# Patient Record
Sex: Male | Born: 1990 | Race: White | Hispanic: No | Marital: Single | Smoking: Current every day smoker
Health system: Southern US, Community
[De-identification: ages and names within clinical notes are randomized; demographics above are authoritative.]

---

## 2013-04-18 ENCOUNTER — Encounter (HOSPITAL_COMMUNITY): Payer: Self-pay | Admitting: Emergency Medicine

## 2013-04-18 ENCOUNTER — Emergency Department (INDEPENDENT_AMBULATORY_CARE_PROVIDER_SITE_OTHER)
Admission: EM | Admit: 2013-04-18 | Discharge: 2013-04-18 | Disposition: A | Discharge status: Home / Self Care | Payer: BC Managed Care – PPO | Source: Home / Self Care

## 2013-04-18 DIAGNOSIS — A084 Viral intestinal infection, unspecified: Secondary | ICD-10-CM

## 2013-04-18 DIAGNOSIS — A088 Other specified intestinal infections: Secondary | ICD-10-CM

## 2013-04-18 MED ORDER — ONDANSETRON 4 MG PO TBDP
8.0000 mg | ORAL_TABLET | Freq: Once | ORAL | Status: AC
  Administered 2013-04-18: 8 mg via ORAL

## 2013-04-18 MED ORDER — ONDANSETRON 8 MG PO TBDP: 8.0000 mg | ORAL_TABLET | Freq: Three times a day (TID) | ORAL | Status: DC | PRN

## 2013-04-18 MED ORDER — ONDANSETRON 4 MG PO TBDP
ORAL_TABLET | ORAL | Status: AC
  Filled 2013-04-18: qty 2

## 2013-04-18 NOTE — ED Provider Notes (Signed)
CSN: [id number]     Arrival date & time 04/18/13  1857 History   None    Chief Complaint  Patient presents with  . GI Problem    Patient is a 23 y.o. male presenting with GI illness. The history is provided by the patient.  GI Problem This is a new problem. The current episode started more than 2 days ago. The problem occurs daily. The problem has been gradually improving. Associated symptoms include abdominal pain. Pertinent negatives include no chest pain, no headaches and no shortness of breath. Nothing aggravates the symptoms. The symptoms are relieved by medications.  Pt reports onset of low grade fever (T-Max-100), body aches and diarrhea Thursday. Several episodes of diarrhea the first 24 hours that has resolved with OTC Imodium. Pt had onset of nausea Saturday that has persisted and worsened. Generalized abd pain and bloating persist. He has had no vomiting. Was around a family member last week with flu-like symptoms. Pt is eating and drinking but has noted decreased urinary output.  History reviewed. No pertinent past medical history. History reviewed. No pertinent past surgical history. History reviewed. No pertinent family history. History  Substance Use Topics  . Smoking status: Current Every Day Smoker  . Smokeless tobacco: Not on file  . Alcohol Use: Yes    Review of Systems  Constitutional: Positive for fever and appetite change.  HENT: Negative.   Eyes: Negative.   Respiratory: Negative.  Negative for shortness of breath.   Cardiovascular: Negative.  Negative for chest pain.  Gastrointestinal: Positive for nausea, abdominal pain and diarrhea. Negative for vomiting, constipation, blood in stool and abdominal distention.  Endocrine: Negative.   Genitourinary: Negative.   Musculoskeletal: Positive for myalgias.  Skin: Negative.   Allergic/Immunologic: Negative.   Neurological: Negative.  Negative for headaches.  Hematological: Negative.   Psychiatric/Behavioral:  Negative.     Allergies  Review of patient's allergies indicates no known allergies.  Home Medications   Current Outpatient Rx  Name  Route  Sig  Dispense  Refill  . ondansetron (ZOFRAN ODT) 8 MG disintegrating tablet   Oral   Take 1 tablet (8 mg total) by mouth every 8 (eight) hours as needed for nausea or vomiting.   20 tablet   0    BP 118/70  Pulse 84  Temp(Src) 99 F (37.2 C) (Oral)  Resp 18  SpO2 99% Physical Exam  Constitutional: He is oriented to person, place, and time. He appears well-developed and well-nourished. No distress.  HENT:  Head: Normocephalic and atraumatic.  Eyes: Conjunctivae are normal.  Cardiovascular: Normal rate and regular rhythm.   Pulmonary/Chest: Effort normal and breath sounds normal.  Abdominal: Soft. Bowel sounds are normal. He exhibits no distension and no ascites. No hernia. Hernia confirmed negative in the ventral area.  Very mild generalized abd TTP  Neurological: He is alert and oriented to person, place, and time.  Skin: Skin is warm and dry.  Psychiatric: He has a normal mood and affect.    ED Course  Procedures (including critical care time) Labs Review Labs Reviewed - No data to display Imaging Review No results found.   MDM   1. Viral gastroenteritis    4 days of symptoms c/w viral gastroenteritis. Diarrhea resolved w/ otc med but nausea persist. Pt eating and drinking despite nausea. Is not tachycardic. I do not appreciate significant dehydration. Zofran ODT 8mg  given here. Pt tolerating PO fluids prior to d/c. Rx for Zofran and instructions for bland diet and  cautious use of Imodium. Pt verbalizes understanding and is agreeable w/ plan.     Leanne ChangKatherine P Jahnia Hewes, NP 04/18/13 2027

## 2013-04-18 NOTE — ED Notes (Signed)
C/o fever, nausea (no vomiting) and diarrhea since 3-12; no similar ill contacts. NAD at present

## 2013-04-18 NOTE — Discharge Instructions (Signed)
Viral Gastroenteritis °Viral gastroenteritis is also called stomach flu. This illness is caused by a certain type of germ (virus). It can cause sudden watery poop (diarrhea) and throwing up (vomiting). This can cause you to lose body fluids (dehydration). This illness usually lasts for 3 to 8 days. It usually goes away on its own. °HOME CARE  °· Drink enough fluids to keep your pee (urine) clear or pale yellow. Drink small amounts of fluids often. °· Ask your doctor how to replace body fluid losses (rehydration). °· Avoid: °· Foods high in sugar. °· Alcohol. °· Bubbly (carbonated) drinks. °· Tobacco. °· Juice. °· Caffeine drinks. °· Very hot or cold fluids. °· Fatty, greasy foods. °· Eating too much at one time. °· Dairy products until 24 to 48 hours after your watery poop stops. °· You may eat foods with active cultures (probiotics). They can be found in some yogurts and supplements. °· Wash your hands well to avoid spreading the illness. °· Only take medicines as told by your doctor. Do not give aspirin to children. Do not take medicines for watery poop (antidiarrheals). °· Ask your doctor if you should keep taking your regular medicines. °· Keep all doctor visits as told. °GET HELP RIGHT AWAY IF:  °· You cannot keep fluids down. °· You do not pee at least once every 6 to 8 hours. °· You are short of breath. °· You see blood in your poop or throw up. This may look like coffee grounds. °· You have belly (abdominal) pain that gets worse or is just in one small spot (localized). °· You keep throwing up or having watery poop. °· You have a fever. °· The patient is a child younger than 3 months, and he or she has a fever. °· The patient is a child older than 3 months, and he or she has a fever and problems that do not go away. °· The patient is a child older than 3 months, and he or she has a fever and problems that suddenly get worse. °· The patient is a baby, and he or she has no tears when crying. °MAKE SURE YOU:    °· Understand these instructions. °· Will watch your condition. °· Will get help right away if you are not doing well or get worse. °Document Released: 07/10/2007 Document Revised: 04/15/2011 Document Reviewed: 11/07/2010 °ExitCare® Patient Information ©2014 [employer]. ° °

## 2013-04-18 NOTE — ED Notes (Signed)
Encouraged to sip on gingerale

## 2013-04-20 NOTE — ED Provider Notes (Signed)
Medical screening examination/treatment/procedure(s) were performed by resident physician or non-physician practitioner and as supervising physician I was immediately available for consultation/collaboration.   Barkley BrunsKINDL,Minta Fair DOUGLAS MD.   Linna HoffJames D Karliah Kowalchuk, MD 04/20/13 [phone]

## 2013-11-15 ENCOUNTER — Encounter (HOSPITAL_COMMUNITY): Payer: Self-pay | Admitting: Emergency Medicine

## 2013-11-15 ENCOUNTER — Emergency Department (HOSPITAL_COMMUNITY)
Admission: EM | Admit: 2013-11-15 | Discharge: 2013-11-16 | Disposition: A | Discharge status: Other Acute Inpatient Hospital | Payer: BC Managed Care – PPO | Attending: Emergency Medicine | Admitting: Emergency Medicine

## 2013-11-15 DIAGNOSIS — R45851 Suicidal ideations: Secondary | ICD-10-CM | POA: Insufficient documentation

## 2013-11-15 DIAGNOSIS — F32A Depression, unspecified: Secondary | ICD-10-CM

## 2013-11-15 DIAGNOSIS — Z720 Tobacco use: Secondary | ICD-10-CM | POA: Diagnosis not present

## 2013-11-15 DIAGNOSIS — F329 Major depressive disorder, single episode, unspecified: Secondary | ICD-10-CM

## 2013-11-15 NOTE — ED Notes (Signed)
Pt. reports feeling depressed with suicidal ideation for 2 weeks , pt. did not disclose plan of suicide , no hallucinations .

## 2013-11-16 ENCOUNTER — Encounter (HOSPITAL_COMMUNITY): Payer: Self-pay | Admitting: *Deleted

## 2013-11-16 ENCOUNTER — Observation Stay (HOSPITAL_COMMUNITY)
Admission: AD | Admit: 2013-11-16 | Discharge: 2013-11-17 | Disposition: A | Discharge status: Home / Self Care | Payer: BC Managed Care – PPO | Source: Intra-hospital | Attending: Nurse Practitioner | Admitting: Nurse Practitioner

## 2013-11-16 DIAGNOSIS — F329 Major depressive disorder, single episode, unspecified: Principal | ICD-10-CM | POA: Insufficient documentation

## 2013-11-16 DIAGNOSIS — R45851 Suicidal ideations: Secondary | ICD-10-CM

## 2013-11-16 DIAGNOSIS — F332 Major depressive disorder, recurrent severe without psychotic features: Secondary | ICD-10-CM

## 2013-11-16 LAB — CBC WITH DIFFERENTIAL/PLATELET
BASOS PCT: 0 % (ref 0–1)
Basophils Absolute: 0 10*3/uL (ref 0.0–0.1)
EOS ABS: 0.2 10*3/uL (ref 0.0–0.7)
EOS PCT: 2 % (ref 0–5)
HCT: 46 % (ref 39.0–52.0)
Hemoglobin: 16.5 g/dL (ref 13.0–17.0)
LYMPHS ABS: 2.2 10*3/uL (ref 0.7–4.0)
Lymphocytes Relative: 34 % (ref 12–46)
MCH: 30.6 pg (ref 26.0–34.0)
MCHC: 35.9 g/dL (ref 30.0–36.0)
MCV: 85.2 fL (ref 78.0–100.0)
Monocytes Absolute: 0.4 10*3/uL (ref 0.1–1.0)
Monocytes Relative: 6 % (ref 3–12)
NEUTROS PCT: 58 % (ref 43–77)
Neutro Abs: 3.9 10*3/uL (ref 1.7–7.7)
PLATELETS: 217 10*3/uL (ref 150–400)
RBC: 5.4 MIL/uL (ref 4.22–5.81)
RDW: 12 % (ref 11.5–15.5)
WBC: 6.7 10*3/uL (ref 4.0–10.5)

## 2013-11-16 LAB — COMPREHENSIVE METABOLIC PANEL
ALBUMIN: 4.1 g/dL (ref 3.5–5.2)
ALT: 31 U/L (ref 0–53)
ANION GAP: 13 (ref 5–15)
AST: 16 U/L (ref 0–37)
Alkaline Phosphatase: 70 U/L (ref 39–117)
BUN: 10 mg/dL (ref 6–23)
CO2: 26 mEq/L (ref 19–32)
Calcium: 9.4 mg/dL (ref 8.4–10.5)
Chloride: 99 mEq/L (ref 96–112)
Creatinine, Ser: 0.97 mg/dL (ref 0.50–1.35)
GFR calc Af Amer: >90 mL/min (ref >90)
GFR calc non Af Amer: >90 mL/min (ref >90)
Glucose, Bld: 89 mg/dL (ref 70–99)
POTASSIUM: 3.8 meq/L (ref 3.7–5.3)
SODIUM: 138 meq/L (ref 137–147)
TOTAL PROTEIN: 8 g/dL (ref 6.0–8.3)
Total Bilirubin: 0.3 mg/dL (ref 0.3–1.2)

## 2013-11-16 LAB — RAPID URINE DRUG SCREEN, HOSP PERFORMED
Amphetamines: NOT DETECTED
BENZODIAZEPINES: NOT DETECTED
Barbiturates: NOT DETECTED
COCAINE: NOT DETECTED
Opiates: NOT DETECTED
TETRAHYDROCANNABINOL: NOT DETECTED

## 2013-11-16 LAB — ETHANOL

## 2013-11-16 MED ORDER — ALUM & MAG HYDROXIDE-SIMETH 200-200-20 MG/5ML PO SUSP: 30.0000 mL | ORAL | Status: DC | PRN

## 2013-11-16 MED ORDER — MAGNESIUM HYDROXIDE 400 MG/5ML PO SUSP: 30.0000 mL | Freq: Every day | ORAL | Status: DC | PRN

## 2013-11-16 MED ORDER — CITALOPRAM HYDROBROMIDE 10 MG PO TABS
10.0000 mg | ORAL_TABLET | Freq: Every day | ORAL | Status: DC
  Administered 2013-11-16 – 2013-11-17 (×2): 10 mg via ORAL
  Filled 2013-11-16 (×5): qty 1

## 2013-11-16 MED ORDER — ACETAMINOPHEN 325 MG PO TABS: 650.0000 mg | ORAL_TABLET | ORAL | Status: DC | PRN

## 2013-11-16 MED ORDER — ACETAMINOPHEN 325 MG PO TABS: 650.0000 mg | ORAL_TABLET | Freq: Four times a day (QID) | ORAL | Status: DC | PRN

## 2013-11-16 MED ORDER — ZOLPIDEM TARTRATE 5 MG PO TABS: 5.0000 mg | ORAL_TABLET | Freq: Every evening | ORAL | Status: DC | PRN

## 2013-11-16 MED ORDER — IBUPROFEN 400 MG PO TABS: 600.0000 mg | ORAL_TABLET | Freq: Three times a day (TID) | ORAL | Status: DC | PRN

## 2013-11-16 MED ORDER — TRAZODONE HCL 50 MG PO TABS: 50.0000 mg | ORAL_TABLET | Freq: Every evening | ORAL | Status: DC | PRN

## 2013-11-16 MED ORDER — ONDANSETRON HCL 4 MG PO TABS: 4.0000 mg | ORAL_TABLET | Freq: Three times a day (TID) | ORAL | Status: DC | PRN

## 2013-11-16 MED ORDER — LORAZEPAM 1 MG PO TABS: 1.0000 mg | ORAL_TABLET | Freq: Three times a day (TID) | ORAL | Status: DC | PRN

## 2013-11-16 MED ORDER — HYDROXYZINE HCL 25 MG PO TABS: 25.0000 mg | ORAL_TABLET | Freq: Four times a day (QID) | ORAL | Status: DC | PRN

## 2013-11-16 MED ORDER — NICOTINE 21 MG/24HR TD PT24
21.0000 mg | MEDICATED_PATCH | Freq: Every day | TRANSDERMAL | Status: DC
  Administered 2013-11-16: 21 mg via TRANSDERMAL
  Filled 2013-11-16 (×5): qty 1

## 2013-11-16 NOTE — BH Assessment (Signed)
TTS assessment complete.  Safira Proffit, MS, LCASA Assessment Counselor  

## 2013-11-16 NOTE — Progress Notes (Signed)
Patient ID: [name], male   DOB: 1990-11-24, 23 y.o.   MRN: [id number] D-settling in to the unit, slept most of the shift since arriving. Spoke with his girlfriend on the phone. He did meet with the extender, and will be started on medications tonight; Celexa for his depression. A-Support offered, Monitored for safety. Medications as ordered. R-No complaints voiced. Working on [employer] and reading magazines made available to him. Pleasant and no complaints.

## 2013-11-16 NOTE — BH Assessment (Signed)
Tele Assessment Note   [name] is an 23 y.o. male. Pt presents to [facility] with C/O increased depression and SI. Pt reports recent verbal conflict with his girlfriend over the past week as a stressor. Pt reports that he presented to the hospital because his girlfriend is concerned about him. Patient reports that he discontinued taking his psychiatric medication Seroquel over a month ago due to side effects.  Patient reports that the medication made him sleep too much. Patient reports suffering from withdraw symptoms of N/V for a week after he stopped taking his prescribed Seroquel.  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 Patient reports increased anxiety when he is around a lot of people.  Patient reports erratic sleep as reports sometimes he sleeps 4 hours and sometimes 13 hours at a time. Pt reports increased weight gain over the past 3 months. Patient reports that he gained 30lbs over the past 2-3 months. Pt denies HI and no AVH. Patient reports that he wants to be evaluated so he can be started on the "right" medications.   Patient reports that he has an appointment scheduled with his Psychiatric Provider [name], PA  on 11-29-13 and has another appointment with his therapist on 11-19-13(Larry Letta KocherWillet). Patient is unable to reliably contract for safety until he can meet with his providers.  Consulted with AC Thurman CoyerEric Kaplan and Ellen Henrionrad Withrow,NP whom is recommending inpatient psychiatric treatment for safety and stabilization. Pt has been assigned to observation bed #3.  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 Axis IV: other psychosocial or environmental problems and problems related to social environment Axis V: 21-30 behavior considerably influenced by delusions or hallucinations OR serious impairment in judgment,  communication OR inability to function in almost all areas  Past Medical History: No past medical history on file.  No past surgical history on file.  Family History: No family history on file.  Social History:  reports that he has been smoking.  He does not have any smokeless tobacco history on file. He reports that he drinks alcohol. He reports that he uses illicit drugs (Marijuana).  Additional Social History:  Alcohol / Drug Use Pain Medications: not abusing Prescriptions: not abusing Over the Counter: not abusing History of alcohol / drug use?:  (pt reports hx of etoh and THC use on occasion at least 1x per month.)  CIWA: CIWA-Ar BP: 143/83 mmHg Pulse Rate: 56 COWS:    PATIENT STRENGTHS: (choose at least two) Average or above average intelligence Capable of independent living  Allergies: No Known Allergies  Home Medications:  No prescriptions prior to admission    OB/GYN Status:  No LMP for male patient.  General Assessment Data [facility] Is this a Tele or Face-to-Face Assessment?: Tele Assessment Is this an Initial Assessment or a Re-assessment for this encounter?: Initial Assessment Living Arrangements: Spouse/significant other Can pt return to current living arrangement?: Yes Admission Status: Voluntary Is patient capable of signing voluntary admission?: Yes Transfer from: Home Referral Source: MD     [facility] Living Arrangements: Spouse/significant other Name of Psychiatrist: [name], PA([facility]) Name of Therapist: Janalee DaneLarry Willet  Education Status Is patient currently in school?: Yes Current Grade: ArchivistCollege Student Highest grade of school patient has completed: 12th Name of school: [employer] Contact person: n/a  Risk to self with  the past 6 months Suicidal Ideation: Yes-Currently Present Suicidal Intent: No-Not Currently/Within Last 6 Months Is patient at risk for suicide?: Yes Suicidal Plan?: No Access  to Means: No What has been your use of drugs/alcohol within the last 12 months?: occasional THC and etoh use Previous Attempts/Gestures: No How many times?: 0 Other Self Harm Risks: none reported Triggers for Past Attempts: None known Intentional Self Injurious Behavior: None Family Suicide History: No Recent stressful life event(s): Conflict (Comment) (verbal conflict with girlfriend, stopped taking medication.) Persecutory voices/beliefs?: No Depression: Yes Depression Symptoms: Despondent;Insomnia;Fatigue;Loss of interest in usual pleasures;Feeling worthless/self pity Substance abuse history and/or treatment for substance abuse?: No Suicide prevention information given to non-admitted patients: Not applicable  Risk to Others within the past 6 months Homicidal Ideation: No Thoughts of Harm to Others: No Current Homicidal Intent: No Current Homicidal Plan: No Access to Homicidal Means: No Identified Victim: na History of harm to others?: No Assessment of Violence: None Noted Violent Behavior Description: None Noted Does patient have access to weapons?: Yes (Comment) (pt reports access to kitchen knives and collection of swords) Criminal Charges Pending?: No Does patient have a court date: No  Psychosis Hallucinations: None noted Delusions: None noted  Mental Status Report Appear/Hygiene: In scrubs Eye Contact: Fair Motor Activity: Freedom of movement Speech: Logical/coherent Level of Consciousness: Alert Mood: Depressed Affect: Appropriate to circumstance Anxiety Level: None Thought Processes: Coherent;Relevant Judgement: Unimpaired Orientation: Person;Place;Time;Situation Obsessive Compulsive Thoughts/Behaviors: None  Cognitive Functioning Concentration: Normal Memory: Recent Intact;Remote Intact IQ: Average Insight: Fair Impulse Control: Fair Appetite: Fair (pt reports increased appetite causing excessive weight gain) Weight Loss: 0 Weight Gain:  (30) Sleep:   (varies- one day 4 hrs, the next day 14 hours of sleep) Total Hours of Sleep:  (varies significantly ) Vegetative Symptoms: None  ADLScreening [facility]) Patient's cognitive ability adequate to safely complete daily activities?: Yes Patient able to express need for assistance with ADLs?: Yes Independently performs ADLs?: Yes (appropriate for developmental age)  Prior Inpatient Therapy Prior Inpatient Therapy: No Prior Therapy Dates: na Prior Therapy Facilty/Provider(s): na Reason for Treatment: na  Prior Outpatient Therapy Prior Outpatient Therapy: Yes Prior Therapy Dates: Current Provider Prior Therapy Facilty/Provider(s): [facility] Reason for Treatment: Med management/OPT  ADL Screening (condition at time of admission) Patient's cognitive ability adequate to safely complete daily activities?: Yes Is the patient deaf or have difficulty hearing?: No Does the patient have difficulty seeing, even when wearing glasses/contacts?: No Does the patient have difficulty concentrating, remembering, or making decisions?: No Patient able to express need for assistance with ADLs?: Yes Does the patient have difficulty dressing or bathing?: No Independently performs ADLs?: Yes (appropriate for developmental age) Does the patient have difficulty walking or climbing stairs?: No Weakness of Legs: None Weakness of Arms/Hands: None  Home Assistive Devices/Equipment Home Assistive Devices/Equipment: None  Therapy Consults (therapy consults require a physician order) PT Evaluation Needed: No OT Evalulation Needed: No SLP Evaluation Needed: No Abuse/Neglect Assessment (Assessment to be complete while patient is alone) Physical Abuse: Denies Verbal Abuse: Denies Sexual Abuse: Denies Exploitation of patient/patient's resources: Denies Self-Neglect: Denies Values / Beliefs Cultural Requests During Hospitalization: None Spiritual Requests During Hospitalization:  None Consults Spiritual Care Consult Needed: No Social Work Consult Needed: No Merchant navy officerAdvance Directives (For Healthcare) Does patient have an advance directive?: No Would patient like information on creating an advanced directive?: No - patient declined information Nutrition Screen- [facility] Adult/[facility]/AP Patient's home diet: Regular  Additional Information 1:1 In Past 12 Months?: No  CIRT Risk: No Elopement Risk: No Does patient have medical clearance?: Yes     Disposition:  Disposition Initial Assessment Completed for this Encounter: Yes Disposition of Patient: Inpatient treatment program (Per Renata Caprice pt accepted to observation unit bed #3.) Type of inpatient treatment program: Adult  Gerline Legacy, MS, LCASA Assessment Counselor  11/16/2013 2:07 PM

## 2013-11-16 NOTE — Progress Notes (Signed)
Pt alert, oriented and cooperative. Affect/mood sad and depressed. Denies SI/HI at present, verbally contracts for safety. -A/[place]. Denies pain or discomfort. C/o stressors r/t school, recent move in with girlfriend and quit job "much life changes". Emotional support and encouragement given. Will continue to monitor closely and evaluate for stabilization.

## 2013-11-16 NOTE — Progress Notes (Signed)
Patient ID: [name], male   DOB: Jan 14, 1991, 23 y.o.   MRN: [id number] Admission Note-[facility] after he presented to the ED last HS with increasing depressive sx and thoughts of suicide with plan to overdose on medication.He currently says he thinks about it but doesn't think he would ever do something and he is able to verbalize safety while here. He denies psychotic sx. He is not dangerous to others. He has no previous hospitalizations. He has been seeing a therapist and Miguel extender thru [facility] for two months. He has taken Seroquel one month ago but stopped it himself because he thought it made his depression worse and he was too drowsy. He is a current Consulting civil engineerstudent at [employer] and states his school work and attendance hasnt suffered with his depression.No substance use of significance. He drinks twice a month and will smoke THC twice a month but not to excess. He has no medical problems and no known family history of mental health issues, but he knows very little about his fathers side of the family. He is living with his girlfriend and her cousin. His girlfriend was the factor that brought him to the hospital last night because she was concerned for him and believes he has been acting irrationally. He endorses depression sx of poor sleep and never rested, low motivation, decreased concentration and thoughts to hurt self. States he has been depressed on and off for years. Given food and drink on admission, settled in to the unit and waiting on the Dr. For their recommendation. Cooperative with the admission process.

## 2013-11-16 NOTE — ED Provider Notes (Signed)
CSN: [id number]     Arrival date & time 11/15/13  2314 History   First MD Initiated Contact with Patient 11/15/13 2346     Chief Complaint  Patient presents with  . Suicidal     (Consider location/radiation/quality/duration/timing/severity/associated sxs/prior Treatment) The history is provided by the patient and medical records.   This is a 23 year old male with past medical history significant for depression, presenting to the ED for increased depression and suicidal ideation over the past 2 weeks. Patient states about one month ago he stopped taking his Seroquel because it was making him drowsy and felt his depression was worsening. He states he has also recently moved in with his girlfriend and started back to school which is causing added stress. He denies any specific plan of suicide. He has no prior history of suicide attempts in the past. He denies any homicidal ideation. He denies any auditory or visual hallucinations. Patient has taken multiple anti-depressants in the past (lamictal, zoloft, seroquel)-- states that lamictal managed his symptoms the best, but he had multiple adverse reactions (dry mouth, etc).  Occasional EtOH and marijuana use, denies recent use.  Patient [facility].  Patient denies other complaints at this time.  VS stable on arrival.  History reviewed. No pertinent past medical history. History reviewed. No pertinent past surgical history. No family history on file. History  Substance Use Topics  . Smoking status: Current Every Day Smoker  . Smokeless tobacco: Not on file  . Alcohol Use: Yes    Review of Systems  Psychiatric/Behavioral: Positive for suicidal ideas.  All other systems reviewed and are negative.     Allergies  Review of patient's allergies indicates no known allergies.  Home Medications   Prior to Admission medications   Not on File   BP 147/94  Pulse 69  Temp(Src) 98.2 F  (36.8 C)  Resp 18  Wt 180 lb 8 oz (81.874 kg)  SpO2 98%  Physical Exam  Nursing note and vitals reviewed. Constitutional: He is oriented to person, place, and time. He appears well-developed and well-nourished. No distress.  HENT:  Head: Normocephalic and atraumatic.  Mouth/Throat: Oropharynx is clear and moist.  Eyes: Conjunctivae and EOM are normal. Pupils are equal, round, and reactive to light.  Neck: Normal range of motion. Neck supple.  Cardiovascular: Normal rate, regular rhythm and normal heart sounds.   Pulmonary/Chest: Effort normal and breath sounds normal. No respiratory distress. He has no wheezes.  Abdominal: Soft. Bowel sounds are normal. There is no tenderness. There is no guarding.  Musculoskeletal: Normal range of motion. He exhibits no edema.  Neurological: He is alert and oriented to person, place, and time.  Skin: Skin is warm and dry. He is not diaphoretic.  Psychiatric: He is not withdrawn and not actively hallucinating. He exhibits a depressed mood. He expresses suicidal ideation. He expresses no homicidal ideation. He expresses no suicidal plans and no homicidal plans.  Depressed mood, flat affect; SI without plan; denies HI/AVH    ED Course  Procedures (including critical care time) Labs Review Labs Reviewed  ETHANOL  URINE RAPID DRUG SCREEN (HOSP PERFORMED)  CBC WITH DIFFERENTIAL  COMPREHENSIVE METABOLIC PANEL    Imaging Review No results found.   EKG Interpretation None      MDM   Final diagnoses:  Suicidal ideation  Depression   23 year old male with increased depression and suicidal ideation.  He does note added stress from recent change in housing situation  and starting school.  He has no current physical complaints. Physical exam is unremarkable. Lab work was obtained which is reassuring. Patient medically cleared and awaiting TTS evaluation. Temporary holding orders and all meds in place.  Garlon HatchetLisa M Dorinne Graeff, PA-C 11/16/13 [phone]

## 2013-11-16 NOTE — BH Assessment (Signed)
Spoke with EDP Dr.Kohut to inform him that patient has been accepted to obs unit bed #4 per Ellen Henrionrad Withrow,NP. Attending assigned is Liana Geroldr.Archana Kumar.    Pod C nurse Aundra MilletMegan has been notified of pt's acceptance and will complete voluntary support paperwork for patient and coordinate transport with [facility].   Glorious PeachNajah Sila Sarsfield, MS, LCASA Assessment Counselor

## 2013-11-16 NOTE — ED Notes (Signed)
[facility] called for transport. 

## 2013-11-16 NOTE — H&P (Signed)
[facility] OBS UNIT H&P  Patient Identification:  [name] Date of Evaluation:  11/16/2013 Chief Complaint:  DEPRESSIVE DISORDER NOS  Subjective: Pt seen and chart reviewed. Pt reports SI without plan, cannot contract for safety. Denies HI and AVH. Pt states that he recently started school and had to move out of a previous home and she felt overwhelmed.    History of Present Illness:  This is a 23 year old male with past medical history significant for depression, presenting to the ED for increased depression and suicidal ideation over the past 2 weeks. Patient states about one month ago he stopped taking his Seroquel because it was making him drowsy and felt his depression was worsening. He states he has also recently moved in with his girlfriend and started back to school which is causing added stress. He denies any specific plan of suicide. He has no prior history of suicide attempts in the past. He denies any homicidal ideation. He denies any auditory or visual hallucinations. Patient has taken multiple anti-depressants in the past (lamictal, zoloft, seroquel)-- states that lamictal managed his symptoms the best, but he had multiple adverse reactions (dry mouth, etc). Occasional EtOH and marijuana use, denies recent use. Patient recently re-established with psychologist and psychiatrist at [facility]. Patient denies other complaints at this time. VS stable on arrival.   Total Time spent with patient: 30 minutes  Psychiatric Specialty Exam: Physical Exam  Psychiatric: He has a normal mood and affect. His speech is normal and behavior is normal. Judgment and thought content normal. Cognition and memory are normal.    Review of Systems  Constitutional: Negative.   HENT: Negative.   Eyes: Negative.   Respiratory: Negative.   Cardiovascular: Negative.   Gastrointestinal: Negative.   Genitourinary: Negative.   Musculoskeletal: Negative.   Skin: Negative.   Neurological: Negative.    Endo/Heme/Allergies: Negative.   Psychiatric/Behavioral: Positive for depression. Negative for suicidal ideas, hallucinations, memory loss and substance abuse. The patient is nervous/anxious. The patient does not have insomnia.     Blood pressure 143/83, pulse 56, temperature 98.6 F (37 C), temperature source Oral, resp. rate 18, height _0  (1.778 m), weight 81.647 kg (180 lb).Body mass index is 25.83 kg/(m^2).  General Appearance: Fairly Groomed  Engineer, water::  Good  Speech:  Normal Rate  Volume:  Normal  Mood:  Depressed  Affect:  Appropriate  Thought Process:  Linear  Orientation:  Full (Time, Place, and Person)  Thought Content:  Rumination  Suicidal Thoughts:  No  Homicidal Thoughts:  No  Memory:  Immediate;   Good Recent;   Good Remote;   Good  Judgement:  Good  Insight:  Good  Psychomotor Activity:  Normal  Concentration:  Good  Recall:  Good  Fund of Knowledge:Good  Language: Negative  Akathisia:  Negative  Handed:  Right  AIMS (if indicated):     Assets:  Communication Skills Desire for Improvement Resilience Social Support  Sleep:       Musculoskeletal: Strength & Muscle Tone: within normal limits Gait & Station: normal Patient leans: N/A  Past Medical History:  No past medical history on file. None. Allergies:  No Known Allergies PTA Medications: No prescriptions prior to admission    Previous Psychotropic Medications:  Medication/Dose  SEE MAR               Substance Abuse History in the last 12 months:  No.  Consequences of Substance Abuse: NA  Social History:  reports that he has  been smoking.  He does not have any smokeless tobacco history on file. He reports that he drinks alcohol. His drug history is not on file. Additional Social History: Pain Medications: not abusing Prescriptions: not abusing Over the Counter: not abusing History of alcohol / drug use?: No history of alcohol / drug abuse  Family History:  No family  history on file.  Results for orders placed during the hospital encounter of 11/15/13 (from the past 72 hour(s))  ETHANOL     Status: None   Collection Time    11/15/13 11:45 PM      Result Value Ref Range   Alcohol, Ethyl (B) <11  0 - 11 mg/dL   Comment:            LOWEST DETECTABLE LIMIT FOR     SERUM ALCOHOL IS 11 mg/dL     FOR MEDICAL PURPOSES ONLY  CBC WITH DIFFERENTIAL     Status: None   Collection Time    11/15/13 11:45 PM      Result Value Ref Range   WBC 6.7  4.0 - 10.5 K/uL   RBC 5.40  4.22 - 5.81 MIL/uL   Hemoglobin 16.5  13.0 - 17.0 g/dL   HCT 46.0  39.0 - 52.0 %   MCV 85.2  78.0 - 100.0 fL   MCH 30.6  26.0 - 34.0 pg   MCHC 35.9  30.0 - 36.0 g/dL   RDW 12.0  11.5 - 15.5 %   Platelets 217  150 - 400 K/uL   Neutrophils Relative % 58  43 - 77 %   Neutro Abs 3.9  1.7 - 7.7 K/uL   Lymphocytes Relative 34  12 - 46 %   Lymphs Abs 2.2  0.7 - 4.0 K/uL   Monocytes Relative 6  3 - 12 %   Monocytes Absolute 0.4  0.1 - 1.0 K/uL   Eosinophils Relative 2  0 - 5 %   Eosinophils Absolute 0.2  0.0 - 0.7 K/uL   Basophils Relative 0  0 - 1 %   Basophils Absolute 0.0  0.0 - 0.1 K/uL  COMPREHENSIVE METABOLIC PANEL     Status: None   Collection Time    11/15/13 11:45 PM      Result Value Ref Range   Sodium 138  137 - 147 mEq/L   Potassium 3.8  3.7 - 5.3 mEq/L   Chloride 99  96 - 112 mEq/L   CO2 26  19 - 32 mEq/L   Glucose, Bld 89  70 - 99 mg/dL   BUN 10  6 - 23 mg/dL   Creatinine, Ser 0.97  0.50 - 1.35 mg/dL   Calcium 9.4  8.4 - 10.5 mg/dL   Total Protein 8.0  6.0 - 8.3 g/dL   Albumin 4.1  3.5 - 5.2 g/dL   AST 16  0 - 37 U/L   ALT 31  0 - 53 U/L   Alkaline Phosphatase 70  39 - 117 U/L   Total Bilirubin 0.3  0.3 - 1.2 mg/dL   GFR calc non Af Amer >90  >90 mL/min   GFR calc Af Amer >90  >90 mL/min   Comment: (NOTE)     The eGFR has been calculated using the CKD EPI equation.     This calculation has not been validated in all clinical situations.     eGFR's persistently  <90 mL/min signify possible Chronic Kidney     Disease.   Anion gap 13  5 - 15  URINE RAPID DRUG SCREEN (HOSP PERFORMED)     Status: None   Collection Time    11/15/13 11:51 PM      Result Value Ref Range   Opiates NONE DETECTED  NONE DETECTED   Cocaine NONE DETECTED  NONE DETECTED   Benzodiazepines NONE DETECTED  NONE DETECTED   Amphetamines NONE DETECTED  NONE DETECTED   Tetrahydrocannabinol NONE DETECTED  NONE DETECTED   Barbiturates NONE DETECTED  NONE DETECTED   Comment:            DRUG SCREEN FOR MEDICAL PURPOSES     ONLY.  IF CONFIRMATION IS NEEDED     FOR ANY PURPOSE, NOTIFY LAB     WITHIN 5 DAYS.                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 AXIS IV:  other psychosocial or environmental problems AXIS V:  51-60 moderate symptoms   Treatment Plan/Recommendations:   -Celexa 33m daily for depression -Vistaril 276mq6h PRN for anxiety  Treatment Plan Summary: Daily contact with patient to assess and evaluate symptoms and progress in treatment Medication management Current Medications:  No current facility-administered medications for this encounter.    ShWaymartAGNP-BC 11/16/2013 7:39 pm

## 2013-11-16 NOTE — Progress Notes (Signed)
[facility] INPATIENT:  Family/Significant Other Suicide Prevention Education  Suicide Prevention Education:  Patient Refusal for Family/Significant Other Suicide Prevention Education: The patient [name] has refused to provide written consent for family/significant other to be provided Family/Significant Other Suicide Prevention Education during admission and/or prior to discharge.  Physician notified.  Wynona LunaBeck, Eran Mistry K 11/16/2013, 12:53 PM

## 2013-11-16 NOTE — ED Notes (Signed)
Girlfriend given information on visiting hours, explained process.  Her number is [phone].  She can be called if he need transportation home.

## 2013-11-17 DIAGNOSIS — F329 Major depressive disorder, single episode, unspecified: Secondary | ICD-10-CM

## 2013-11-17 MED ORDER — HYDROXYZINE HCL 25 MG PO TABS: 25.0000 mg | ORAL_TABLET | Freq: Four times a day (QID) | ORAL | Status: AC | PRN

## 2013-11-17 MED ORDER — CITALOPRAM HYDROBROMIDE 10 MG PO TABS: 10.0000 mg | ORAL_TABLET | Freq: Every day | ORAL | Status: AC

## 2013-11-17 NOTE — Progress Notes (Signed)
Patient ID: [name], male   DOB: 1990/06/08, 23 y.o.   MRN: [id number] D-No complaints re side effects or problems related to taking first dose of Celexa yesterday. Gave him his am meds as ordered. Quiet, offers little without a direct question.Pleasant, flat affect. A-Monitored for safety Support offered. R-No complaints voiced. Spoke with Renata Capriceonrad NP and he is anticipating discharge today. He denies any thoughts to hurt self or others.

## 2013-11-17 NOTE — Progress Notes (Signed)
Patient ID: [name], male   DOB: 22-Dec-1990, 23 y.o.   MRN: [id number] Discharge Note-Seen this am by Renata Capriceonrad NP and he consulted with Dr Lucianne MussKumar, and it was determined that he would be discharged home today. Conrad sent him home with Rx of Celexa and Vistaril.  He denies any thoughts to hurt self or others. He states he is feeling some better since being here. All of his property was returned to him. His girlfriend was called and she is picking him up.Reviewed with him his discharge plans, and he verbalized his understanding.As he was being discharged by writer he asked for a letter to excuse him from school. Renette Buttersontacted Conrad NP for letter but he is unavailable. Wrote a letter on [employer] letterhead stating that client had been her from 10/13-10/14/2015 and gave it to him. His girlfriend and another woman were here for him.

## 2013-11-17 NOTE — Plan of Care (Signed)
[facility] Observation Crisis Plan  Reason for Crisis Plan:  Medication Management   Plan of Care:  Referral to current outpatient provider  Family Support:    Mother, father, girlfriend  Current Living Environment:  Living Arrangements: Spouse/significant other; pt lives with his girlfriend and her cousin; he can return to household.  Insurance:  [facility]   Name Acct ID Class Status Primary Coverage   [name], [name] [id number] BEHAVIORAL HEALTH OBSERVATION Open BLUE CROSS BLUE SHIELD - BCBS PPO OUT OF STATE        Guarantor Account (for Hospital Account [id number])   Name Relation to Pt Service Area Active? Acct Type   [name], [name] Self [facility] Yes Behavioral Health   Address Phone       [address] [place], [place] [address] [phone])          Coverage Information (for Hospital Account [id number])   F/O Payor/Plan Precert #   BLUE CROSS BLUE SHIELD/BCBS PPO OUT OF STATE    Subscriber Subscriber #   [name], [name]   Address Phone   [address] [place], [place] [address] [phone]      Legal Guardian:   Self  Primary Care Provider:  No PCP Per Patient  Current Outpatient Providers:  [facility]; pt has signed Consent to Release Information  Psychiatrist:  Name of Psychiatrist: [name], PA([facility])  Counselor/Therapist:  Name of Therapist: Janalee DaneLarry Shepherd  Compliant with Medications:  Yes; pt is not on any medications  Additional Information: After consulting with Miguel Headonrad Withrow, NP it has been determined that, provided he is able to contract for safety, pt does not present a life threatening danger to himself or others, and that psychiatric hospitalization is not indicated for him at this time.  Pt signed No-Harm Contract.  Mental Health Intensive Outpatient Programming was discussed with pt as an option, but pt reports that he is not able to accommodate the schedule at this  time.  He believes that the appointments that he currently has with Miguel Fulay Shugart, PA and Eyvonne MechanicLarry Willett, PhD at [facility] are best suited to his needs at this time.  Pt has signed Consent to Release Information to them.  Contact information for [facility] will be included in the pt's discharge instructions.  Miguel Canninghomas Freeland Pracht, MA Triage Specialist [name], [name] 10/14/201511:42 AM

## 2013-11-17 NOTE — Discharge Instructions (Addendum)
For your ongoing behavioral health needs you are advised to keep your previously scheduled appointments at [facility]:       [facility]      [address]      [address]      [place], [place] [address]       [phone]   If you have a behavioral health crisis you have several options, all of which are available 24 hours a day, 7 days a week:  *Call the phone numbers on your No-Harm Contract: [phone] or toll free [phone] *Call Mobile Crisis and a clinician will come to you: [phone] *Call 911 *Go to the [facility] at [address], [place], [place] *Go to your local hospital emergency department

## 2013-11-17 NOTE — Discharge Summary (Signed)
[facility] OBS UNIT DISCHARGE SUMMARY  Patient Identification:  [name] Date of Evaluation:  11/17/2013 Chief Complaint:  DEPRESSIVE DISORDER NOS  Subjective: Pt seen and chart reviewed. Pt denies SI, HI, and AVH, contracts for safety. Pt sees providers and counselors at [facility] and would like to followup there. Tom with [facility] to assist with setting up followup appointment.   History of Present Illness:  This is a 23 year old male with past medical history significant for depression, presenting to the ED for increased depression and suicidal ideation over the past 2 weeks. Patient states about one month ago he stopped taking his Seroquel because it was making him drowsy and felt his depression was worsening. He states he has also recently moved in with his girlfriend and started back to school which is causing added stress. He denies any specific plan of suicide. He has no prior history of suicide attempts in the past. He denies any homicidal ideation. He denies any auditory or visual hallucinations. Patient has taken multiple anti-depressants in the past (lamictal, zoloft, seroquel)-- states that lamictal managed his symptoms the best, but he had multiple adverse reactions (dry mouth, etc). Occasional ETOH and marijuana use, denies recent use. Patient recently re-established with psychologist and psychiatrist at [facility] Patient denies other complaints at this time. VS stable on arrival.   Total Time spent with patient: 25 minutes   Psychiatric Specialty Exam: Physical Exam  Psychiatric: He has a normal mood and affect. His speech is normal and behavior is normal. Judgment and thought content normal. Cognition and memory are normal.    Review of Systems  Constitutional: Negative.   HENT: Negative.   Eyes: Negative.   Respiratory: Negative.   Cardiovascular: Negative.   Gastrointestinal: Negative.   Genitourinary: Negative.   Musculoskeletal: Negative.   Skin: Negative.    Neurological: Negative.   Endo/Heme/Allergies: Negative.   Psychiatric/Behavioral: Positive for depression. Negative for suicidal ideas, hallucinations, memory loss and substance abuse. The patient is nervous/anxious. The patient does not have insomnia.     Blood pressure 110/69, pulse 67, temperature 97.6 F (36.4 C), temperature source Oral, resp. rate 16, height _0  (1.778 m), weight 81.647 kg (180 lb).Body mass index is 25.83 kg/(m^2).  General Appearance: Fairly Groomed  Engineer, water::  Good  Speech:  Normal Rate  Volume:  Normal  Mood:  Depressed  Affect:  Appropriate  Thought Process:  Linear  Orientation:  Full (Time, Place, and Person)  Thought Content:  Rumination  Suicidal Thoughts:  No  Homicidal Thoughts:  No  Memory:  Immediate;   Good Recent;   Good Remote;   Good  Judgement:  Good  Insight:  Good  Psychomotor Activity:  Normal  Concentration:  Good  Recall:  Good  Fund of Knowledge:Good  Language: Negative  Akathisia:  Negative  Handed:  Right  AIMS (if indicated):     Assets:  Communication Skills Desire for Improvement Resilience Social Support  Sleep:       Musculoskeletal: Strength & Muscle Tone: within normal limits Gait & Station: normal Patient leans: N/A  Past Medical History:  No past medical history on file. None. Allergies:  No Known Allergies PTA Medications: No prescriptions prior to admission    Previous Psychotropic Medications:  Medication/Dose  SEE MAR               Substance Abuse History in the last 12 months:  No.  Consequences of Substance Abuse: NA  Social History:  reports that he  has been smoking.  He does not have any smokeless tobacco history on file. He reports that he drinks alcohol. He reports that he uses illicit drugs (Marijuana). Additional Social History: Pain Medications: not abusing Prescriptions: not abusing Over the Counter: not abusing History of alcohol / drug use?:  (pt reports hx of  etoh and THC use on occasion at least 1x per month.)  Family History:  No family history on file.  Results for orders placed during the hospital encounter of 11/15/13 (from the past 72 hour(s))  ETHANOL     Status: None   Collection Time    11/15/13 11:45 PM      Result Value Ref Range   Alcohol, Ethyl (B) <11  0 - 11 mg/dL   Comment:            LOWEST DETECTABLE LIMIT FOR     SERUM ALCOHOL IS 11 mg/dL     FOR MEDICAL PURPOSES ONLY  CBC WITH DIFFERENTIAL     Status: None   Collection Time    11/15/13 11:45 PM      Result Value Ref Range   WBC 6.7  4.0 - 10.5 K/uL   RBC 5.40  4.22 - 5.81 MIL/uL   Hemoglobin 16.5  13.0 - 17.0 g/dL   HCT 46.0  39.0 - 52.0 %   MCV 85.2  78.0 - 100.0 fL   MCH 30.6  26.0 - 34.0 pg   MCHC 35.9  30.0 - 36.0 g/dL   RDW 12.0  11.5 - 15.5 %   Platelets 217  150 - 400 K/uL   Neutrophils Relative % 58  43 - 77 %   Neutro Abs 3.9  1.7 - 7.7 K/uL   Lymphocytes Relative 34  12 - 46 %   Lymphs Abs 2.2  0.7 - 4.0 K/uL   Monocytes Relative 6  3 - 12 %   Monocytes Absolute 0.4  0.1 - 1.0 K/uL   Eosinophils Relative 2  0 - 5 %   Eosinophils Absolute 0.2  0.0 - 0.7 K/uL   Basophils Relative 0  0 - 1 %   Basophils Absolute 0.0  0.0 - 0.1 K/uL  COMPREHENSIVE METABOLIC PANEL     Status: None   Collection Time    11/15/13 11:45 PM      Result Value Ref Range   Sodium 138  137 - 147 mEq/L   Potassium 3.8  3.7 - 5.3 mEq/L   Chloride 99  96 - 112 mEq/L   CO2 26  19 - 32 mEq/L   Glucose, Bld 89  70 - 99 mg/dL   BUN 10  6 - 23 mg/dL   Creatinine, Ser 0.97  0.50 - 1.35 mg/dL   Calcium 9.4  8.4 - 10.5 mg/dL   Total Protein 8.0  6.0 - 8.3 g/dL   Albumin 4.1  3.5 - 5.2 g/dL   AST 16  0 - 37 U/L   ALT 31  0 - 53 U/L   Alkaline Phosphatase 70  39 - 117 U/L   Total Bilirubin 0.3  0.3 - 1.2 mg/dL   GFR calc non Af Amer >90  >90 mL/min   GFR calc Af Amer >90  >90 mL/min   Comment: (NOTE)     The eGFR has been calculated using the CKD EPI equation.     This  calculation has not been validated in all clinical situations.     eGFR's persistently <90 mL/min signify possible Chronic  Kidney     Disease.   Anion gap 13  5 - 15  URINE RAPID DRUG SCREEN (HOSP PERFORMED)     Status: None   Collection Time    11/15/13 11:51 PM      Result Value Ref Range   Opiates NONE DETECTED  NONE DETECTED   Cocaine NONE DETECTED  NONE DETECTED   Benzodiazepines NONE DETECTED  NONE DETECTED   Amphetamines NONE DETECTED  NONE DETECTED   Tetrahydrocannabinol NONE DETECTED  NONE DETECTED   Barbiturates NONE DETECTED  NONE DETECTED   Comment:            DRUG SCREEN FOR MEDICAL PURPOSES     ONLY.  IF CONFIRMATION IS NEEDED     FOR ANY PURPOSE, NOTIFY LAB     WITHIN 5 DAYS.                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 AXIS IV:  other psychosocial or environmental problems AXIS V:  51-60 moderate symptoms    Current Medications:  Current Facility-Administered Medications  Medication Dose Route Frequency Provider Last Rate Last Dose  . acetaminophen (TYLENOL) tablet 650 mg  650 mg Oral Q6H PRN Benjamine Mola, FNP      . alum & mag hydroxide-simeth (MAALOX/MYLANTA) 200-200-20 MG/5ML suspension 30 mL  30 mL Oral Q4H PRN Benjamine Mola, FNP      . citalopram (CELEXA) tablet 10 mg  10 mg Oral Daily Benjamine Mola, FNP   10 mg at 11/17/13 0277  . hydrOXYzine (ATARAX/VISTARIL) tablet 25  mg  25 mg Oral Q6H PRN Benjamine Mola, FNP      . magnesium hydroxide (MILK OF MAGNESIA) suspension 30 mL  30 mL Oral Daily PRN Benjamine Mola, FNP      . nicotine (NICODERM CQ - dosed in mg/24 hours) patch 21 mg  21 mg Transdermal Daily Benjamine Mola, FNP   21 mg at 11/16/13 1344  . traZODone (DESYREL) tablet 50 mg  50 mg Oral QHS PRN Benjamine Mola, FNP         Medication List         citalopram 10 MG tablet  Commonly known as:  CELEXA  Take 1 tablet (10 mg total) by mouth daily.     hydrOXYzine 25 MG tablet  Commonly known as:  ATARAX/VISTARIL  Take 1 tablet (25 mg total) by mouth every 6 (six) hours as needed for anxiety.        Treatment Plan/Recommendations:   -  Celexa 54m daily for depression  -Vistaril 244mq6h PRN for anxiety  -Discharge home with outpatient resources/referrals   WiBenjamine MolaFNP-BC   11/17/2013 01:05 PM

## 2013-11-19 ENCOUNTER — Encounter (HOSPITAL_BASED_OUTPATIENT_CLINIC_OR_DEPARTMENT_OTHER): Payer: Self-pay | Admitting: Emergency Medicine

## 2013-11-19 ENCOUNTER — Emergency Department (HOSPITAL_BASED_OUTPATIENT_CLINIC_OR_DEPARTMENT_OTHER): Payer: BC Managed Care – PPO

## 2013-11-19 ENCOUNTER — Emergency Department (HOSPITAL_BASED_OUTPATIENT_CLINIC_OR_DEPARTMENT_OTHER)
Admission: EM | Admit: 2013-11-19 | Discharge: 2013-11-19 | Disposition: A | Discharge status: Home / Self Care | Payer: BC Managed Care – PPO | Attending: Emergency Medicine | Admitting: Emergency Medicine

## 2013-11-19 DIAGNOSIS — S99929A Unspecified injury of unspecified foot, initial encounter: Secondary | ICD-10-CM

## 2013-11-19 DIAGNOSIS — S93601A Unspecified sprain of right foot, initial encounter: Secondary | ICD-10-CM | POA: Diagnosis not present

## 2013-11-19 DIAGNOSIS — S99921A Unspecified injury of right foot, initial encounter: Secondary | ICD-10-CM | POA: Diagnosis present

## 2013-11-19 DIAGNOSIS — Z720 Tobacco use: Secondary | ICD-10-CM | POA: Diagnosis not present

## 2013-11-19 DIAGNOSIS — Y9289 Other specified places as the place of occurrence of the external cause: Secondary | ICD-10-CM | POA: Diagnosis not present

## 2013-11-19 DIAGNOSIS — Y9389 Activity, other specified: Secondary | ICD-10-CM | POA: Diagnosis not present

## 2013-11-19 DIAGNOSIS — W01198A Fall on same level from slipping, tripping and stumbling with subsequent striking against other object, initial encounter: Secondary | ICD-10-CM | POA: Diagnosis not present

## 2013-11-19 NOTE — ED Provider Notes (Signed)
CSN: [id number]     Arrival date & time 11/19/13  0244 History   First MD Initiated Contact with Patient 11/19/13 0256     Chief Complaint  Patient presents with  . Foot Injury     (Consider location/radiation/quality/duration/timing/severity/associated sxs/prior Treatment) HPI Comments: Patient is a 23 year old male who presents with complaints of left foot pain. He states he got to the bathroom and rolled his foot awkwardly under the bed. He states he "felt a pop". He has had pain and swelling since that time along the top and lateral aspect of his left foot.  Patient is a 23 y.o. male presenting with foot injury. The history is provided by the patient.  Foot Injury Location:  Foot Injury: yes   Foot location:  L foot Pain details:    Radiates to:  Does not radiate   Severity:  Moderate   Onset quality:  Sudden   Timing:  Constant   Progression:  Unchanged   History reviewed. No pertinent past medical history. History reviewed. No pertinent past surgical history. History reviewed. No pertinent family history. History  Substance Use Topics  . Smoking status: Current Every Day Smoker  . Smokeless tobacco: Not on file  . Alcohol Use: Yes    Review of Systems  All other systems reviewed and are negative.     Allergies  Review of patient's allergies indicates no known allergies.  Home Medications   Prior to Admission medications   Medication Sig Start Date End Date Taking? Authorizing Provider  citalopram (CELEXA) 10 MG tablet Take 1 tablet (10 mg total) by mouth daily. 11/17/13  Yes Beau FannyJohn C Withrow, FNP  hydrOXYzine (ATARAX/VISTARIL) 25 MG tablet Take 1 tablet (25 mg total) by mouth every 6 (six) hours as needed for anxiety. 11/17/13   Everardo AllJohn C Withrow, FNP   BP 135/79  Pulse 91  Temp(Src) 98 F (36.7 C) (Oral)  Resp 16  Ht 5\' 10"  (1.778 m)  Wt 180 lb (81.647 kg)  BMI 25.83 kg/m2  SpO2 99% Physical Exam  Nursing note and vitals reviewed. Constitutional: He is  oriented to person, place, and time. He appears well-developed and well-nourished. No distress.  HENT:  Head: Normocephalic and atraumatic.  Neck: Normal range of motion. Neck supple.  Musculoskeletal:  There is mild swelling to the lateral aspect of the left foot and also tenderness to palpation over the dorsum of the left foot over the proximal fourth metatarsal. Distal cap refill, sensation, and motor are intact.  Neurological: He is alert and oriented to person, place, and time.  Skin: Skin is warm and dry. He is not diaphoretic.    ED Course  Procedures (including critical care time) Labs Review Labs Reviewed - No data to display  Imaging Review No results found.   EKG Interpretation None      MDM   Final diagnoses:  None    X-rays negative for fracture. We'll treat as a sprain. Return as needed for any problems and followup if not improving in the next 2 weeks.    Geoffery Lyonsouglas Naysha Sholl, MD 11/19/13 [phone]

## 2013-11-19 NOTE — ED Notes (Addendum)
Tripped and fell on left foot  Swelling to outside of foot  Heard a pop

## 2013-11-19 NOTE — ED Provider Notes (Signed)
Medical screening examination/treatment/procedure(s) were performed by non-physician practitioner and as supervising physician I was immediately available for consultation/collaboration.   EKG Interpretation None       Niah Heinle M Sisto Granillo, MD 11/19/13 0734 

## 2013-11-19 NOTE — ED Notes (Signed)
Was going to bed and foot got caught under carpet and patient fell on left foot "heard a pop"

## 2013-11-19 NOTE — Discharge Instructions (Signed)
Ice for 20 minutes every 2 hours while awake for the next 2 days. Keep foot elevated.  Weightbearing as tolerated.  Followup with your primary Dr. if not improving in the next week.  Ibuprofen 600 mg every 6 hours as needed for pain.   Foot Sprain The muscles and cord like structures which attach muscle to bone (tendons) that surround the feet are made up of units. A foot sprain can occur at the weakest spot in any of these units. This condition is most often caused by injury to or overuse of the foot, as from playing contact sports, or aggravating a previous injury, or from poor conditioning, or obesity. SYMPTOMS  Pain with movement of the foot.  Tenderness and swelling at the injury site.  Loss of strength is present in moderate or severe sprains. THE THREE GRADES OR SEVERITY OF FOOT SPRAIN ARE:  Mild (Grade I): Slightly pulled muscle without tearing of muscle or tendon fibers or loss of strength.  Moderate (Grade II): Tearing of fibers in a muscle, tendon, or at the attachment to bone, with small decrease in strength.  Severe (Grade III): Rupture of the muscle-tendon-bone attachment, with separation of fibers. Severe sprain requires surgical repair. Often repeating (chronic) sprains are caused by overuse. Sudden (acute) sprains are caused by direct injury or over-use. DIAGNOSIS  Diagnosis of this condition is usually by your own observation. If problems continue, a caregiver may be required for further evaluation and treatment. X-rays may be required to make sure there are not breaks in the bones (fractures) present. Continued problems may require physical therapy for treatment. PREVENTION  Use strength and conditioning exercises appropriate for your sport.  Warm up properly prior to working out.  Use athletic shoes that are made for the sport you are participating in.  Allow adequate time for healing. Early return to activities makes repeat injury more likely, and can lead to  an unstable arthritic foot that can result in prolonged disability. Mild sprains generally heal in 3 to 10 days, with moderate and severe sprains taking 2 to 10 weeks. Your caregiver can help you determine the proper time required for healing. HOME CARE INSTRUCTIONS   Apply ice to the injury for 15-20 minutes, 03-04 times per day. Put the ice in a plastic bag and place a towel between the bag of ice and your skin.  An elastic wrap (like an Ace bandage) may be used to keep swelling down.  Keep foot above the level of the heart, or at least raised on a footstool, when swelling and pain are present.  Try to avoid use other than gentle range of motion while the foot is painful. Do not resume use until instructed by your caregiver. Then begin use gradually, not increasing use to the point of pain. If pain does develop, decrease use and continue the above measures, gradually increasing activities that do not cause discomfort, until you gradually achieve normal use.  Use crutches if and as instructed, and for the length of time instructed.  Keep injured foot and ankle wrapped between treatments.  Massage foot and ankle for comfort and to keep swelling down. Massage from the toes up towards the knee.  Only take over-the-counter or prescription medicines for pain, discomfort, or fever as directed by your caregiver. SEEK IMMEDIATE MEDICAL CARE IF:   Your pain and swelling increase, or pain is not controlled with medications.  You have loss of feeling in your foot or your foot turns cold or blue.  You develop new, unexplained symptoms, or an increase of the symptoms that brought you to your caregiver. MAKE SURE YOU:   Understand these instructions.  Will watch your condition.  Will get help right away if you are not doing well or get worse. Document Released: 07/13/2001 Document Revised: 04/15/2011 Document Reviewed: 09/10/2007 [facility] Patient Information 2015 [place], [place]. This information  is not intended to replace advice given to you by your health care provider. Make sure you discuss any questions you have with your health care provider.

## 2013-11-21 NOTE — H&P (Signed)
Case discussed, and agree with plan 

## 2013-11-22 NOTE — Discharge Summary (Signed)
Case discussed, agree with plan 

## 2015-02-15 IMAGING — CR DG FOOT COMPLETE 3+V*L*
3 series · 3 of 3 positions shown · non-contrast
Comparison: None.

CLINICAL DATA: Left foot pain after turning wrong in bed. Initial
encounter.

EXAM:
LEFT FOOT - COMPLETE 3+ VIEW

[t foot ap left]
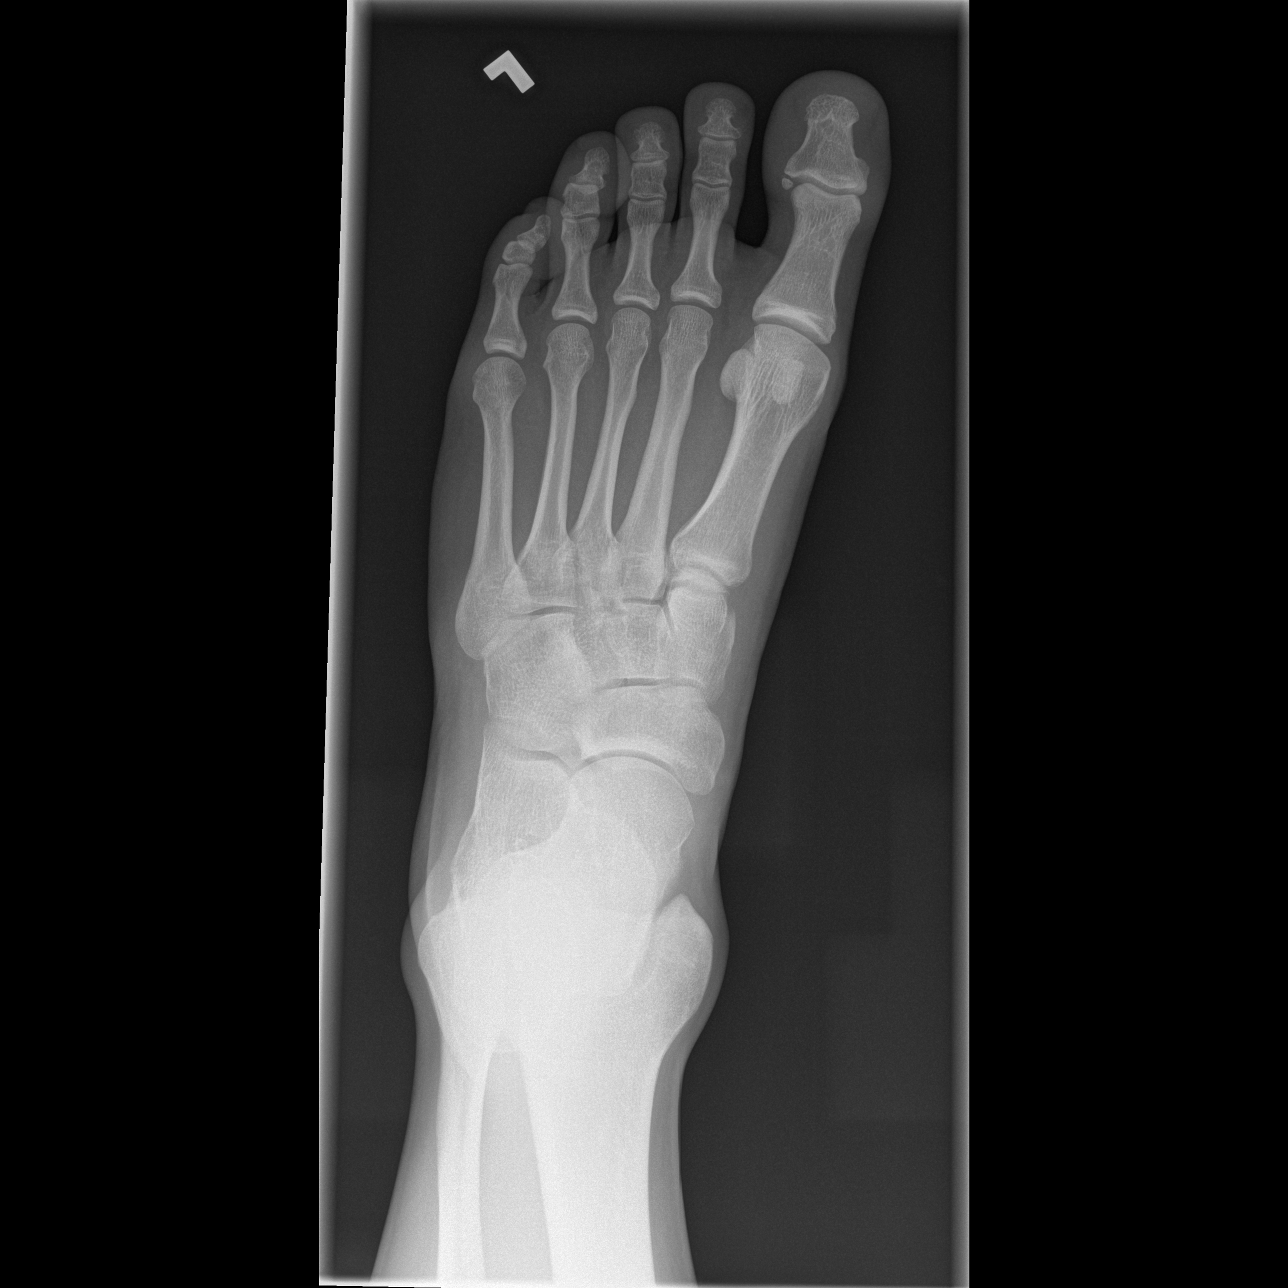

[t foot oblique left]
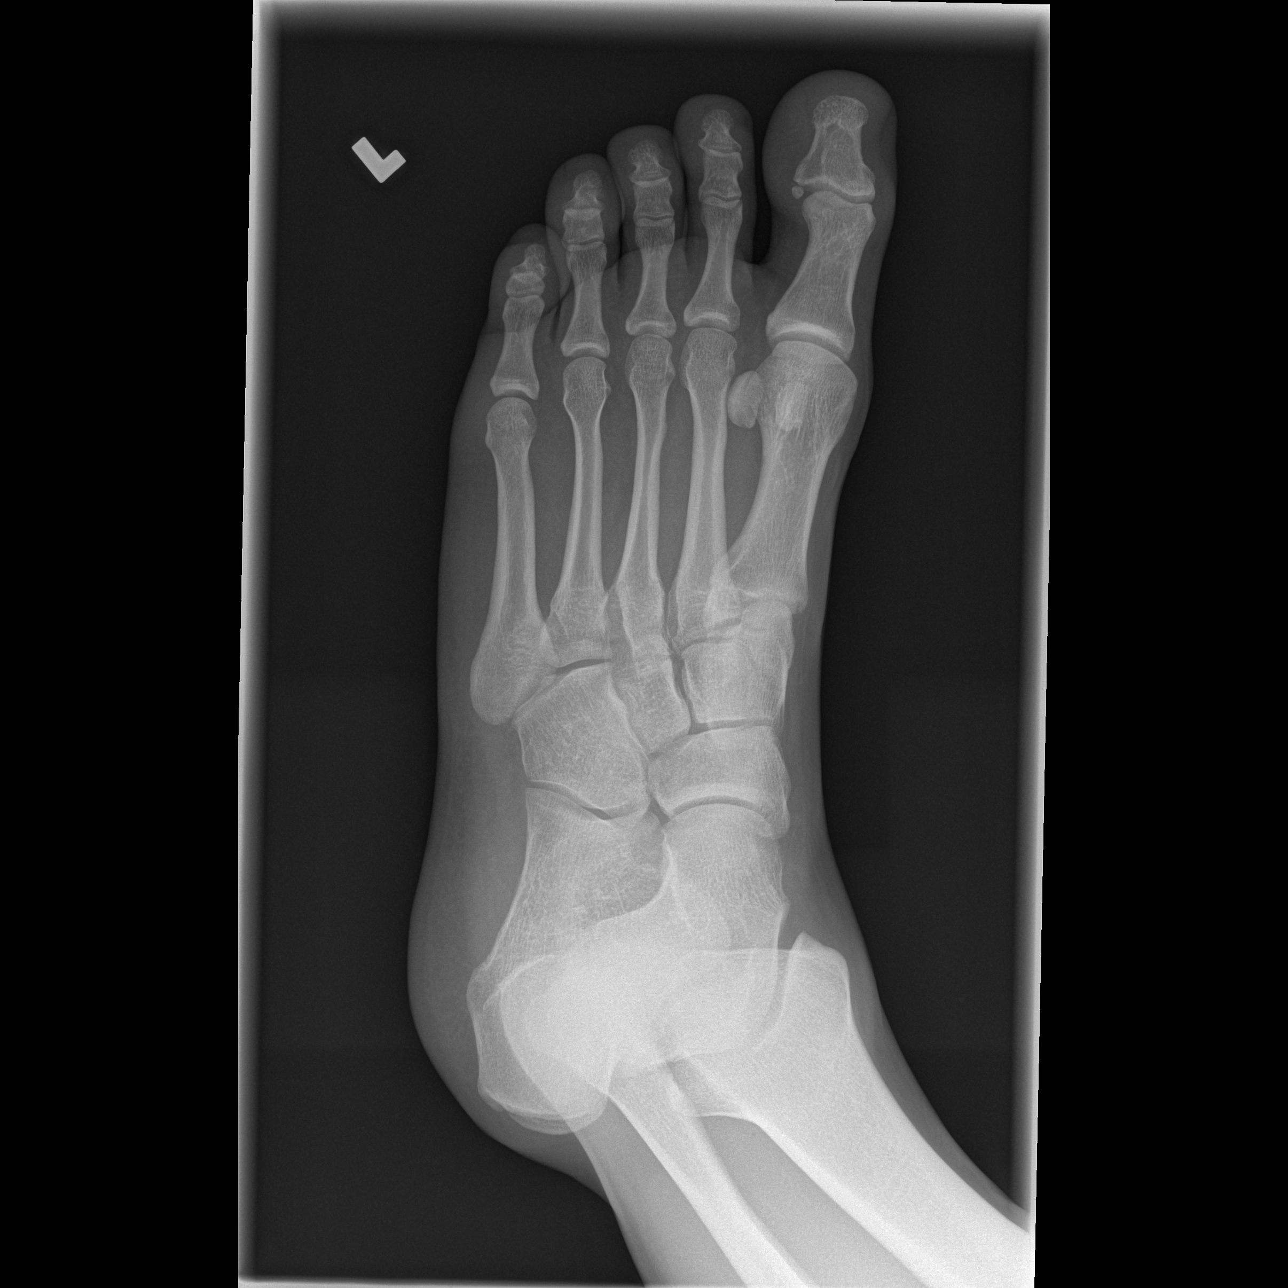

[t foot lat left]
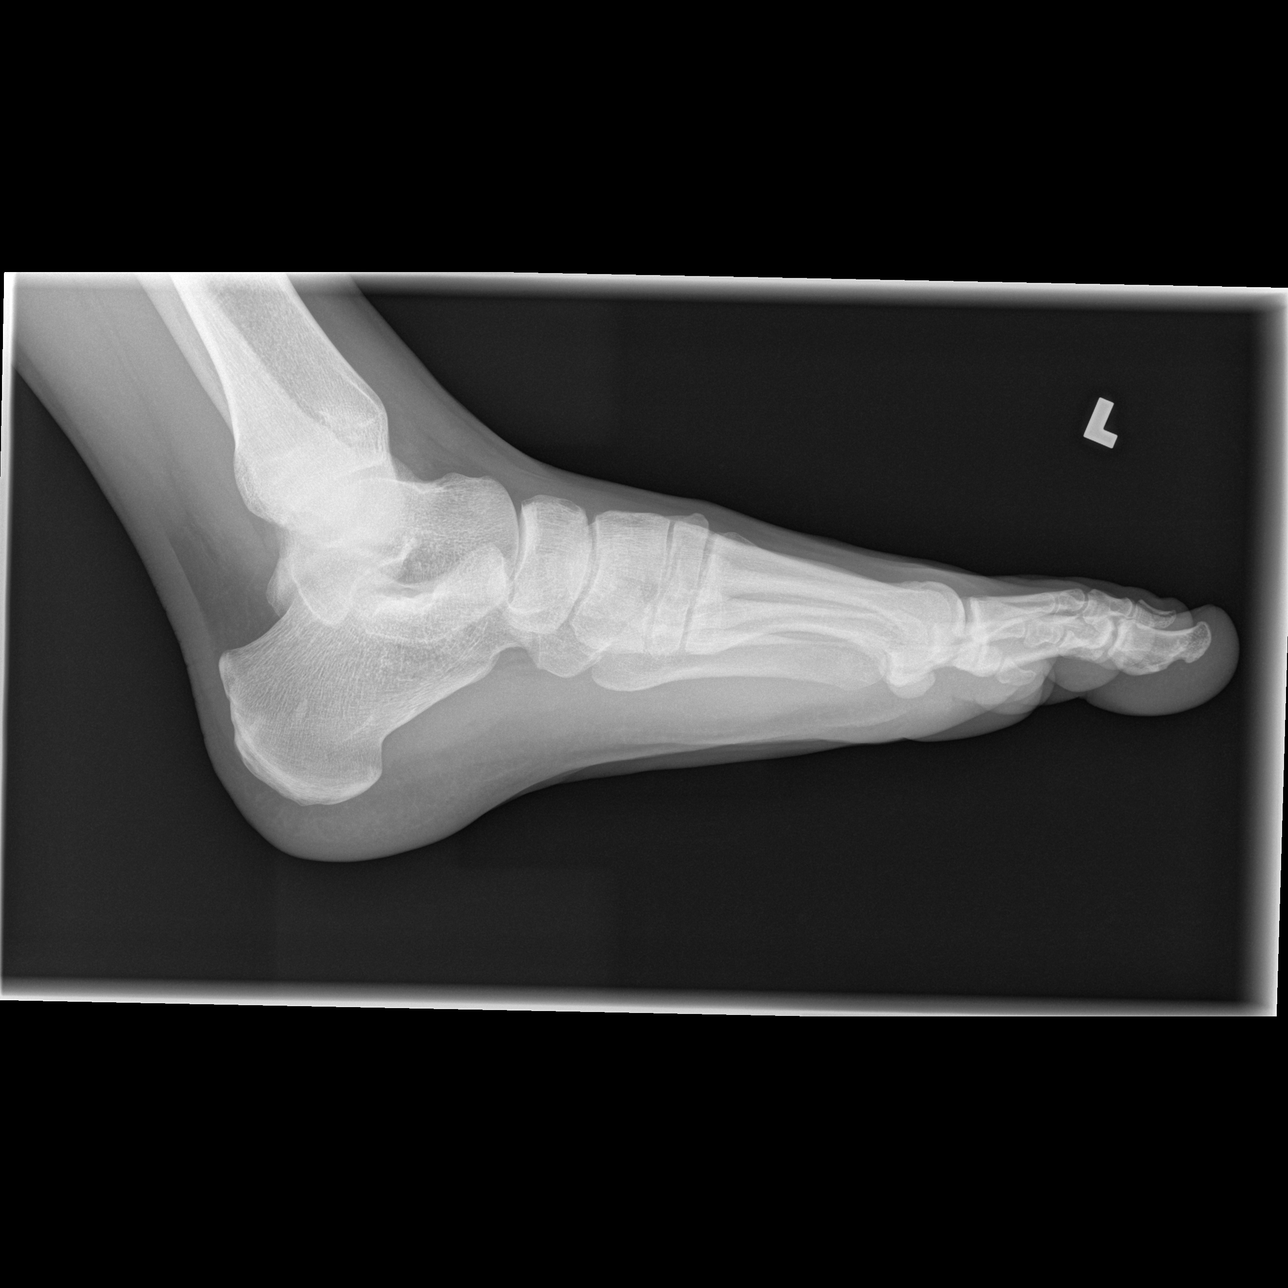

[3 of 3 positions shown; findings below may reference images not displayed]

FINDINGS: There is no evidence of fracture or dislocation. No degenerative
changes. A bone fragment at the great toe interphalangeal joint is
chronic.
IMPRESSION: Negative.
